# Patient Record
Sex: Female | Born: 1980 | Hispanic: No | Marital: Married | Smoking: Never smoker
Health system: Southern US, Community
[De-identification: ages and names within clinical notes are randomized; demographics above are authoritative.]

## PROBLEM LIST (undated history)

## (undated) DIAGNOSIS — N979 Female infertility, unspecified: Secondary | ICD-10-CM

## (undated) DIAGNOSIS — O139 Gestational [pregnancy-induced] hypertension without significant proteinuria, unspecified trimester: Secondary | ICD-10-CM

## (undated) DIAGNOSIS — O1404 Mild to moderate pre-eclampsia, complicating childbirth: Secondary | ICD-10-CM

## (undated) DIAGNOSIS — O09819 Supervision of pregnancy resulting from assisted reproductive technology, unspecified trimester: Secondary | ICD-10-CM

---

## 2011-01-14 LAB — ANTIBODY SCREEN: Antibody Screen: NEGATIVE

## 2011-01-14 LAB — ABO/RH

## 2011-04-16 ENCOUNTER — Other Ambulatory Visit (HOSPITAL_COMMUNITY): Payer: Self-pay | Admitting: Obstetrics and Gynecology

## 2011-04-16 DIAGNOSIS — O30109 Triplet pregnancy, unspecified number of placenta and unspecified number of amniotic sacs, unspecified trimester: Secondary | ICD-10-CM

## 2011-04-27 ENCOUNTER — Inpatient Hospital Stay (HOSPITAL_COMMUNITY): Admission: RE | Admit: 2011-04-27 | Payer: Self-pay | Source: Ambulatory Visit

## 2011-05-09 ENCOUNTER — Ambulatory Visit (HOSPITAL_COMMUNITY)
Admission: RE | Admit: 2011-05-09 | Discharge: 2011-05-09 | Disposition: A | Discharge status: Home / Self Care | Payer: PRIVATE HEALTH INSURANCE | Source: Ambulatory Visit | Attending: Obstetrics and Gynecology | Admitting: Obstetrics and Gynecology

## 2011-05-09 ENCOUNTER — Encounter (HOSPITAL_COMMUNITY): Payer: Self-pay

## 2011-05-09 DIAGNOSIS — O358XX0 Maternal care for other (suspected) fetal abnormality and damage, not applicable or unspecified: Secondary | ICD-10-CM | POA: Insufficient documentation

## 2011-05-09 DIAGNOSIS — Z1389 Encounter for screening for other disorder: Secondary | ICD-10-CM | POA: Insufficient documentation

## 2011-05-09 DIAGNOSIS — Z363 Encounter for antenatal screening for malformations: Secondary | ICD-10-CM | POA: Insufficient documentation

## 2011-05-09 DIAGNOSIS — O30109 Triplet pregnancy, unspecified number of placenta and unspecified number of amniotic sacs, unspecified trimester: Secondary | ICD-10-CM | POA: Insufficient documentation

## 2011-05-09 HISTORY — DX: Female infertility, unspecified: N97.9

## 2011-05-09 HISTORY — DX: Supervision of pregnancy resulting from assisted reproductive technology, unspecified trimester: O09.819

## 2011-06-08 ENCOUNTER — Ambulatory Visit (HOSPITAL_COMMUNITY)
Admission: RE | Admit: 2011-06-08 | Discharge: 2011-06-08 | Disposition: A | Discharge status: Home / Self Care | Payer: PRIVATE HEALTH INSURANCE | Source: Ambulatory Visit | Attending: Obstetrics and Gynecology | Admitting: Obstetrics and Gynecology

## 2011-06-08 DIAGNOSIS — Z3689 Encounter for other specified antenatal screening: Secondary | ICD-10-CM | POA: Insufficient documentation

## 2011-06-08 DIAGNOSIS — O30109 Triplet pregnancy, unspecified number of placenta and unspecified number of amniotic sacs, unspecified trimester: Secondary | ICD-10-CM

## 2011-07-04 ENCOUNTER — Ambulatory Visit (HOSPITAL_COMMUNITY)
Admission: RE | Admit: 2011-07-04 | Discharge: 2011-07-04 | Disposition: A | Discharge status: Home / Self Care | Payer: PRIVATE HEALTH INSURANCE | Source: Ambulatory Visit | Attending: Obstetrics and Gynecology | Admitting: Obstetrics and Gynecology

## 2011-07-04 DIAGNOSIS — Z3689 Encounter for other specified antenatal screening: Secondary | ICD-10-CM | POA: Insufficient documentation

## 2011-07-04 DIAGNOSIS — O30109 Triplet pregnancy, unspecified number of placenta and unspecified number of amniotic sacs, unspecified trimester: Secondary | ICD-10-CM | POA: Insufficient documentation

## 2011-07-06 ENCOUNTER — Ambulatory Visit (HOSPITAL_COMMUNITY): Payer: PRIVATE HEALTH INSURANCE

## 2011-07-23 ENCOUNTER — Ambulatory Visit (HOSPITAL_COMMUNITY)
Admission: RE | Admit: 2011-07-23 | Discharge: 2011-07-23 | Disposition: A | Discharge status: Home / Self Care | Payer: PRIVATE HEALTH INSURANCE | Source: Ambulatory Visit | Attending: Obstetrics and Gynecology | Admitting: Obstetrics and Gynecology

## 2011-07-23 VITALS — BP 105/58 | HR 100 | Wt 160.0 lb

## 2011-07-23 DIAGNOSIS — Z3689 Encounter for other specified antenatal screening: Secondary | ICD-10-CM | POA: Insufficient documentation

## 2011-07-23 DIAGNOSIS — O30109 Triplet pregnancy, unspecified number of placenta and unspecified number of amniotic sacs, unspecified trimester: Secondary | ICD-10-CM | POA: Insufficient documentation

## 2011-07-25 ENCOUNTER — Inpatient Hospital Stay (HOSPITAL_COMMUNITY): Admission: RE | Admit: 2011-07-25 | Payer: PRIVATE HEALTH INSURANCE | Source: Ambulatory Visit

## 2011-08-15 ENCOUNTER — Other Ambulatory Visit (HOSPITAL_COMMUNITY): Payer: Self-pay | Admitting: Maternal and Fetal Medicine

## 2011-08-15 ENCOUNTER — Ambulatory Visit (HOSPITAL_COMMUNITY)
Admission: RE | Admit: 2011-08-15 | Discharge: 2011-08-15 | Disposition: A | Discharge status: Home / Self Care | Payer: PRIVATE HEALTH INSURANCE | Source: Ambulatory Visit | Attending: Obstetrics and Gynecology | Admitting: Obstetrics and Gynecology

## 2011-08-15 DIAGNOSIS — Z3689 Encounter for other specified antenatal screening: Secondary | ICD-10-CM | POA: Insufficient documentation

## 2011-08-15 DIAGNOSIS — O30109 Triplet pregnancy, unspecified number of placenta and unspecified number of amniotic sacs, unspecified trimester: Secondary | ICD-10-CM

## 2011-08-18 ENCOUNTER — Observation Stay (HOSPITAL_COMMUNITY)
Admission: AD | Admit: 2011-08-18 | Discharge: 2011-08-18 | Disposition: A | Discharge status: Home / Self Care | Payer: PRIVATE HEALTH INSURANCE | Source: Ambulatory Visit | Attending: Obstetrics and Gynecology | Admitting: Obstetrics and Gynecology

## 2011-08-18 DIAGNOSIS — IMO0002 Reserved for concepts with insufficient information to code with codable children: Secondary | ICD-10-CM | POA: Insufficient documentation

## 2011-08-18 DIAGNOSIS — R030 Elevated blood-pressure reading, without diagnosis of hypertension: Secondary | ICD-10-CM | POA: Insufficient documentation

## 2011-08-18 DIAGNOSIS — O30109 Triplet pregnancy, unspecified number of placenta and unspecified number of amniotic sacs, unspecified trimester: Secondary | ICD-10-CM | POA: Insufficient documentation

## 2011-08-18 DIAGNOSIS — O99891 Other specified diseases and conditions complicating pregnancy: Principal | ICD-10-CM | POA: Insufficient documentation

## 2011-08-18 LAB — COMPREHENSIVE METABOLIC PANEL
ALT: 10 U/L (ref 0–35)
AST: 21 U/L (ref 0–37)
Albumin: 2.2 g/dL — ABNORMAL LOW (ref 3.5–5.2)
CO2: 20 mEq/L (ref 19–32)
Calcium: 8.2 mg/dL — ABNORMAL LOW (ref 8.4–10.5)
Sodium: 133 mEq/L — ABNORMAL LOW (ref 135–145)
Total Protein: 5.8 g/dL — ABNORMAL LOW (ref 6.0–8.3)

## 2011-08-18 LAB — URINALYSIS, ROUTINE W REFLEX MICROSCOPIC
Bilirubin Urine: NEGATIVE
Nitrite: NEGATIVE
Specific Gravity, Urine: >1.03 — ABNORMAL HIGH (ref 1.005–1.030)
Urobilinogen, UA: 0.2 mg/dL (ref 0.0–1.0)

## 2011-08-18 LAB — CBC
MCH: 29.9 pg (ref 26.0–34.0)
MCHC: 34.3 g/dL (ref 30.0–36.0)
Platelets: 155 10*3/uL (ref 150–400)
RBC: 3.24 MIL/uL — ABNORMAL LOW (ref 3.87–5.11)
RDW: 13.3 % (ref 11.5–15.5)

## 2011-08-18 LAB — URINE MICROSCOPIC-ADD ON

## 2011-08-18 NOTE — ED Provider Notes (Signed)
History    Cc: elev BP @ pharmacy( BP 146/85  31 yo G1P0 MF w/ IVF triplets @ 32 6/[redacted] weeks gestation presents for evaluation of BP 146/85 > pt has recently started having increased leg swelling. (+) nausea yesterday denies heartburn or epigastric pain. No h/a. Nl PIH labs yesterday No chief complaint on file.    OB History    Grav Para Term Preterm Abortions TAB SAB Ect Mult Living   1 0 0 0 0 0 0 0 0 0       Past Medical History  Diagnosis Date  . Infertility, female   . Pregnancy resulting from in vitro fertilization     No past surgical history on file.  No family history on file.  History  Substance Use Topics  . Smoking status: Not on file  . Smokeless tobacco: Not on file  . Alcohol Use: Not on file    Allergies: No Known Allergies  Prescriptions prior to admission  Medication Sig Dispense Refill  . IRON PO Take by mouth.        Marland Kitchen PRENATAL VITAMINS PO Take by mouth.           Physical Exam   Last menstrual period 12/26/2010. BP 123/73, BP 130/80 General appearance: alert, cooperative and no distress Lungs: clear to auscultation bilaterally Heart: regular rate and rhythm, S1, S2 normal, no murmur, click, rub or gallop Abdomen: gravid soft nontender Extremities: edema 2+ (-)clonus DTR 2+  AST 21 ALT 10. hgb 9.7 plt 155k, uric acid 4.3 ED Course  Triplets @ 32 6/7 weeks Peripheral edema P) check u/a. Do 24 hr UTP/CrCl NST x 3. Labs nl d/c home MDM   Madailein Londo A, MD 1:27 PM 08/18/2011

## 2011-08-18 NOTE — Discharge Instructions (Signed)
Preterm Labor Preterm labor is when labor starts at less than 37 weeks of pregnancy. The normal length of a pregnancy is 39 to 41 weeks. CAUSES Often, there is no identifiable underlying cause as to why a woman goes into preterm labor. However, one of the most common known causes of preterm labor is infection. Infections of the uterus, cervix, vagina, amniotic sac, bladder, kidney, or even the lungs (pneumonia) can cause labor to start. Other causes of preterm labor include:  Urogenital infections, such as yeast infections and bacterial vaginosis.   Uterine abnormalities (uterine shape, uterine septum, fibroids, bleeding from the placenta).   A cervix that has been operated on and opens prematurely.   Malformations in the baby.   Multiple gestations (twins, triplets, and so on).   Breakage of the amniotic sac.  Additional risk factors for preterm labor include:  Previous history of preterm labor.   Premature rupture of membranes (PROM).   A placenta that covers the opening of the cervix (placenta previa).   A placenta that separates from the uterus (placenta abruption).   A cervix that is too weak to hold the baby in the uterus (incompetence cervix).   Having too much fluid in the amniotic sac (polyhydramnios).   Taking illegal drugs or smoking while pregnant.   Not gaining enough weight while pregnant.   Women younger than 60 and older than 30 years old.   Low socioeconomic status.   African-American ethnicity.  SYMPTOMS Signs and symptoms of preterm labor include:  Menstrual-like cramps.   Contractions that are 30 to 70 seconds apart, become very regular, closer together, and are more intense and painful.   Contractions that start on the top of the uterus and spread down to the lower abdomen and back.   A sense of increased pelvic pressure or back pain.   A watery or bloody discharge that comes from the vagina.  DIAGNOSIS  A diagnosis can be confirmed by:  A  vaginal exam.   An ultrasound of the cervix.   Sampling (swabbing) cervico-vaginal secretions. These samples can be tested for the presence of fetal fibronectin. This is a protein found in cervical discharge which is associated with preterm labor.   Fetal monitoring.  TREATMENT  Depending on the length of the pregnancy and other circumstances, a caregiver may suggest bed rest. If necessary, there are medicines that can be given to stop contractions and to quicken fetal lung maturity. If labor happens before 34 weeks of pregnancy, a prolonged hospital stay may be recommended. Treatment depends on the condition of both the mother and baby. PREVENTION There are some things a mother can do to lower the risk of preterm labor in future pregnancies. A woman can:   Stop smoking.   Maintain healthy weight gain and avoid chemicals and drugs that are not necessary.   Be watchful for any type of infection.   Inform her caregiver if she has a known history of preterm labor.  Document Released: 08/18/2003 Document Revised: 05/17/2011 Document Reviewed: 09/22/2010 [employer] Patient Information 2012 [employer].24-Hour Urine Collection HOME CARE  When you get up in the morning on the day you do this test, pee (urinate) in the toilet and flush. Make a note of the time. This will be your start time on the day of collection and the end time on the next morning.   From then on, save all your pee (urine) in the plastic jug that was given to you.   You should stop  collecting your pee 24 hours after you started.   If the plastic jug that is given to you already has liquid in it, that is okay. Do not throw out the liquid or rinse out the jug. Some tests need the liquid to be added to your pee.   Keep your plastic jug cool (in an ice chest or the refrigerator) during the test.   When the 24 hours is over, bring your plastic jug to the clinic lab. Keep the jug cool (in an ice chest) while you are bringing  it to the lab.  Document Released: 08/24/2008 Document Revised: 05/17/2011 Document Reviewed: 08/24/2008 [employer] Patient Information 2012 [employer]CHypertension During Pregnancy Hypertension is also called high blood pressure. Blood pressure moves blood in your body. Sometimes, the force that moves the blood becomes too strong. When you are pregnant, this condition should be watched carefully. It can cause problems for you and your baby. HOME CARE   Make and keep all of your doctor visits.   Take medicine as told by your doctor. Tell your doctor about all medicines you take.   Eat very little salt.   Exercise regularly.   Do not drink alcohol.   Do not smoke.   Do not have drinks with caffeine.   Lie on your left side when resting.  GET HELP RIGHT AWAY IF:  You have bad belly (abdominal) pain.   You have sudden puffiness (swelling) in the hands, ankles, or face.   You gain 4 pounds (1.8 kilograms) or more in 1 week.   You throw up (vomit) repeatedly.   You have bleeding from the vagina.   You do not feel the baby moving as much.   You have a headache.   You have blurred or double vision.   You have muscle twitching or spasms.   You have shortness of breath.   You have blue fingernails and lips.   You have blood in your pee (urine).  MAKE SURE YOU:  Understand these instructions.   Will watch your condition.   Will get help right away if you are not doing well.  Document Released: 06/30/2010 Document Revised: 05/17/2011 Document Reviewed: 01/12/2011 [facility] Patient Information 2012 [place], [place].Marland Kitchen

## 2011-08-18 NOTE — Progress Notes (Signed)
Patient taken off external monitoring. Discharge instructions given and patient verbalized understanding.

## 2011-08-18 NOTE — Progress Notes (Signed)
Monitors taken off for D/C

## 2011-08-18 NOTE — Discharge Summary (Signed)
Patient transferred home into family care per MD.

## 2011-08-18 NOTE — Progress Notes (Signed)
Dr. Cherly Hensen updated that patient has been received in L&D. New orders received.

## 2011-08-18 NOTE — Progress Notes (Signed)
Refer to paper strip. Fetal monitor error. Monitor error. RN and MD aware.

## 2011-08-18 NOTE — Progress Notes (Signed)
Dr. Cherly Hensen updated on patient status and lab results. New orders received to D/C and send patient home with 24 hour urine to start in AM.

## 2011-08-22 ENCOUNTER — Encounter (HOSPITAL_COMMUNITY): Payer: Self-pay

## 2011-08-22 ENCOUNTER — Other Ambulatory Visit (HOSPITAL_COMMUNITY): Payer: Self-pay | Admitting: Obstetrics and Gynecology

## 2011-08-22 ENCOUNTER — Ambulatory Visit (HOSPITAL_COMMUNITY): Admission: RE | Admit: 2011-08-22 | Payer: PRIVATE HEALTH INSURANCE | Source: Ambulatory Visit

## 2011-08-22 ENCOUNTER — Inpatient Hospital Stay (HOSPITAL_COMMUNITY)
Admission: AD | Admit: 2011-08-22 | Discharge: 2011-08-27 | DRG: 765 | Disposition: A | Discharge status: Home / Self Care | Payer: PRIVATE HEALTH INSURANCE | Source: Ambulatory Visit | Attending: Obstetrics and Gynecology | Admitting: Obstetrics and Gynecology

## 2011-08-22 ENCOUNTER — Other Ambulatory Visit (HOSPITAL_COMMUNITY): Payer: Self-pay | Admitting: Maternal and Fetal Medicine

## 2011-08-22 ENCOUNTER — Ambulatory Visit (HOSPITAL_COMMUNITY)
Admission: RE | Admit: 2011-08-22 | Discharge: 2011-08-22 | Disposition: A | Discharge status: Home / Self Care | Payer: PRIVATE HEALTH INSURANCE | Source: Ambulatory Visit | Attending: Obstetrics and Gynecology | Admitting: Obstetrics and Gynecology

## 2011-08-22 DIAGNOSIS — Z3689 Encounter for other specified antenatal screening: Secondary | ICD-10-CM | POA: Insufficient documentation

## 2011-08-22 DIAGNOSIS — O30109 Triplet pregnancy, unspecified number of placenta and unspecified number of amniotic sacs, unspecified trimester: Secondary | ICD-10-CM

## 2011-08-22 DIAGNOSIS — O9902 Anemia complicating childbirth: Secondary | ICD-10-CM | POA: Diagnosis present

## 2011-08-22 DIAGNOSIS — IMO0002 Reserved for concepts with insufficient information to code with codable children: Principal | ICD-10-CM | POA: Diagnosis present

## 2011-08-22 DIAGNOSIS — O1404 Mild to moderate pre-eclampsia, complicating childbirth: Secondary | ICD-10-CM | POA: Diagnosis present

## 2011-08-22 DIAGNOSIS — D649 Anemia, unspecified: Secondary | ICD-10-CM | POA: Diagnosis present

## 2011-08-22 HISTORY — DX: Gestational (pregnancy-induced) hypertension without significant proteinuria, unspecified trimester: O13.9

## 2011-08-22 HISTORY — DX: Mild to moderate pre-eclampsia, complicating childbirth: O14.04

## 2011-08-22 LAB — COMPREHENSIVE METABOLIC PANEL
Albumin: 2.4 g/dL — ABNORMAL LOW (ref 3.5–5.2)
BUN: 9 mg/dL (ref 6–23)
Chloride: 101 mEq/L (ref 96–112)
Creatinine, Ser: 0.65 mg/dL (ref 0.50–1.10)
Total Bilirubin: 0.3 mg/dL (ref 0.3–1.2)

## 2011-08-22 LAB — URIC ACID: Uric Acid, Serum: 4.8 mg/dL (ref 2.4–7.0)

## 2011-08-22 MED ORDER — DEXTROSE 5 % IV SOLN
500.0000 mg | INTRAVENOUS | Status: DC
  Filled 2011-08-22: qty 500

## 2011-08-22 MED ORDER — ENOXAPARIN SODIUM 30 MG/0.3ML ~~LOC~~ SOLN: 30.0000 mg | SUBCUTANEOUS | Status: DC

## 2011-08-22 MED ORDER — ACETAMINOPHEN 325 MG PO TABS: 650.0000 mg | ORAL_TABLET | ORAL | Status: DC | PRN

## 2011-08-22 MED ORDER — ZOLPIDEM TARTRATE 10 MG PO TABS: 10.0000 mg | ORAL_TABLET | Freq: Every evening | ORAL | Status: DC | PRN

## 2011-08-22 MED ORDER — AZITHROMYCIN 500 MG PO TABS: 500.0000 mg | ORAL_TABLET | Freq: Every day | ORAL | Status: DC

## 2011-08-22 MED ORDER — NIFEDIPINE 10 MG PO CAPS: 20.0000 mg | ORAL_CAPSULE | Freq: Once | ORAL | Status: DC

## 2011-08-22 MED ORDER — NIFEDIPINE 10 MG PO CAPS: 10.0000 mg | ORAL_CAPSULE | Freq: Four times a day (QID) | ORAL | Status: DC

## 2011-08-22 MED ORDER — DEXTROSE IN LACTATED RINGERS 5 % IV SOLN
INTRAVENOUS | Status: DC
  Administered 2011-08-22: 20:00:00 via INTRAVENOUS

## 2011-08-22 NOTE — H&P (Signed)
Allison Santos is a 31 y.o. female presenting@ 34 1/[redacted] wk gestation w/ triplets and newly diagnosed preeclampsia. Pt c/o increased leg swelling. (+) heartburn starting since yesterday. (+) mild h/a denies visual changes. Pt was seen in MFM today and was found to have elev BP and sent to office for eval. She had been seen over w/e for eval of elev BP. PIH labs were normal and pt was sent home to do 24 hr UTP. The 24 hr UTP was over 700mg . sono 3/6 @ MFM had shown triplet B's growth less than third percentile for parameters. Given new findings of preeclampsia in the setting of growth concern, delivery indicated. Pt last ate @ 1: 40 pm Case reviewed with MFM History OB History    Grav Para Term Preterm Abortions TAB SAB Ect Mult Living   1 0 0 0 0 0 0 0 0 0      Past Medical History  Diagnosis Date  . Infertility, female   . Pregnancy resulting from in vitro fertilization    History reviewed. No pertinent past surgical history. Family History: family history is not on file. Social History:  reports that she has never smoked. She has never used smokeless tobacco. She reports that she does not drink alcohol or use illicit drugs.  ROSsee HPI  Dilation: 1 Exam by:: Dr. Cherly Hensen 1/50/oop Blood pressure 136/83, pulse 85, last menstrual period 12/26/2010. Maternal Exam:  Uterine Assessment: Contraction strength is mild.  Contraction frequency is irregular.   Abdomen: Patient reports no abdominal tenderness. Fetal presentation: breech  Introitus: Amniotic fluid character: not assessed.  Cervix: Cervix evaluated by digital exam.     Physical Exam  Constitutional: She is oriented to person, place, and time. She appears well-developed and well-nourished.  HENT:  Head: Normocephalic.  Eyes: EOM are normal. No scleral icterus.  Neck: Neck supple.  Cardiovascular: Regular rhythm.   Respiratory: Breath sounds normal.  GI: Soft.       Gravid nontender mild palp ctx  Musculoskeletal: She exhibits  edema.       3 (+)  Neurological: She is alert and oriented to person, place, and time.  Skin: Skin is warm and dry.  Psychiatric: She has a normal mood and affect.    Prenatal labs: ABO, Rh:  B positive Antibody:  neg Rubella:  Immune RPR:   NR HBsAg:   neg HIV:   neg GBS:   unknown  Assessment/Plan: Preeclampsia Triplets @ 34 1/7 wk P) admit. Repeat PIH labs. Primary C/S. NST x 3 q shift and continuous fetal monitoring. Given current BP will proceed with C/S in am with staffing. Pt and husband anxious but situation explained Synethia Endicott A 08/22/2011, 6:26 PM

## 2011-08-23 ENCOUNTER — Encounter (HOSPITAL_COMMUNITY): Payer: Self-pay | Admitting: Anesthesiology

## 2011-08-23 ENCOUNTER — Encounter (HOSPITAL_COMMUNITY): Payer: Self-pay | Admitting: *Deleted

## 2011-08-23 ENCOUNTER — Encounter (HOSPITAL_COMMUNITY)
Admission: AD | Disposition: A | Discharge status: Home / Self Care | Payer: Self-pay | Source: Ambulatory Visit | Attending: Obstetrics and Gynecology

## 2011-08-23 ENCOUNTER — Inpatient Hospital Stay (HOSPITAL_COMMUNITY): Payer: PRIVATE HEALTH INSURANCE | Admitting: Anesthesiology

## 2011-08-23 LAB — COMPREHENSIVE METABOLIC PANEL
Alkaline Phosphatase: 119 U/L — ABNORMAL HIGH (ref 39–117)
BUN: 5 mg/dL — ABNORMAL LOW (ref 6–23)
Chloride: 105 mEq/L (ref 96–112)
Creatinine, Ser: 0.52 mg/dL (ref 0.50–1.10)
GFR calc Af Amer: >90 mL/min (ref >90)
GFR calc non Af Amer: >90 mL/min (ref >90)
Glucose, Bld: 66 mg/dL — ABNORMAL LOW (ref 70–99)
Potassium: 3.5 mEq/L (ref 3.5–5.1)
Total Bilirubin: 0.2 mg/dL — ABNORMAL LOW (ref 0.3–1.2)

## 2011-08-23 LAB — ABO/RH: ABO/RH(D): B POS

## 2011-08-23 LAB — CBC
Hemoglobin: 9.6 g/dL — ABNORMAL LOW (ref 12.0–15.0)
MCH: 29.5 pg (ref 26.0–34.0)
MCH: 29.3 pg (ref 26.0–34.0)
MCHC: 33.6 g/dL (ref 30.0–36.0)
MCV: 86.8 fL (ref 78.0–100.0)
Platelets: 151 10*3/uL (ref 150–400)
Platelets: 145 10*3/uL — ABNORMAL LOW (ref 150–400)
RBC: 3.25 MIL/uL — ABNORMAL LOW (ref 3.87–5.11)
RBC: 2.97 MIL/uL — ABNORMAL LOW (ref 3.87–5.11)
RDW: 13 % (ref 11.5–15.5)

## 2011-08-23 LAB — MRSA PCR SCREENING: MRSA by PCR: NEGATIVE

## 2011-08-23 LAB — MAGNESIUM: Magnesium: 3.7 mg/dL — ABNORMAL HIGH (ref 1.5–2.5)

## 2011-08-23 LAB — URIC ACID: Uric Acid, Serum: 5.4 mg/dL (ref 2.4–7.0)

## 2011-08-23 LAB — RPR: RPR Ser Ql: NONREACTIVE

## 2011-08-23 SURGERY — Surgical Case
Anesthesia: Spinal | Wound class: Clean Contaminated

## 2011-08-23 MED ORDER — DIPHENHYDRAMINE HCL 50 MG/ML IJ SOLN
12.5000 mg | INTRAMUSCULAR | Status: DC | PRN
  Administered 2011-08-23 (×2): 12.5 mg via INTRAVENOUS
  Filled 2011-08-23: qty 1

## 2011-08-23 MED ORDER — ONDANSETRON HCL 4 MG PO TABS: 4.0000 mg | ORAL_TABLET | ORAL | Status: DC | PRN

## 2011-08-23 MED ORDER — MAGNESIUM SULFATE BOLUS VIA INFUSION
4.0000 g | Freq: Once | INTRAVENOUS | Status: AC
  Administered 2011-08-23: 4 g via INTRAVENOUS
  Filled 2011-08-23: qty 500

## 2011-08-23 MED ORDER — MORPHINE SULFATE (PF) 0.5 MG/ML IJ SOLN
INTRAMUSCULAR | Status: DC | PRN
  Administered 2011-08-23: .1 mg via EPIDURAL

## 2011-08-23 MED ORDER — LANOLIN HYDROUS EX OINT: TOPICAL_OINTMENT | CUTANEOUS | Status: DC | PRN

## 2011-08-23 MED ORDER — NALBUPHINE HCL 10 MG/ML IJ SOLN
5.0000 mg | INTRAMUSCULAR | Status: DC | PRN
  Filled 2011-08-23: qty 1

## 2011-08-23 MED ORDER — CEFAZOLIN SODIUM 1-5 GM-% IV SOLN
1.0000 g | Freq: Three times a day (TID) | INTRAVENOUS | Status: AC
  Administered 2011-08-23 (×2): 1 g via INTRAVENOUS
  Filled 2011-08-23 (×2): qty 50

## 2011-08-23 MED ORDER — LACTATED RINGERS IV SOLN
INTRAVENOUS | Status: DC | PRN
  Administered 2011-08-23 (×3): via INTRAVENOUS

## 2011-08-23 MED ORDER — CEFAZOLIN SODIUM-DEXTROSE 2-3 GM-% IV SOLR
2.0000 g | INTRAVENOUS | Status: AC
  Administered 2011-08-23: 2 g via INTRAVENOUS
  Filled 2011-08-23: qty 50

## 2011-08-23 MED ORDER — MORPHINE SULFATE 0.5 MG/ML IJ SOLN
INTRAMUSCULAR | Status: AC
  Filled 2011-08-23: qty 10

## 2011-08-23 MED ORDER — DIPHENHYDRAMINE HCL 50 MG/ML IJ SOLN: 25.0000 mg | INTRAMUSCULAR | Status: DC | PRN

## 2011-08-23 MED ORDER — KETOROLAC TROMETHAMINE 30 MG/ML IJ SOLN: 30.0000 mg | Freq: Four times a day (QID) | INTRAMUSCULAR | Status: AC | PRN

## 2011-08-23 MED ORDER — PHENYLEPHRINE HCL 10 MG/ML IJ SOLN
INTRAMUSCULAR | Status: DC | PRN
  Administered 2011-08-23: 80 ug via INTRAVENOUS
  Administered 2011-08-23: 40 ug via INTRAVENOUS
  Administered 2011-08-23: 80 ug via INTRAVENOUS

## 2011-08-23 MED ORDER — PHENYLEPHRINE 40 MCG/ML (10ML) SYRINGE FOR IV PUSH (FOR BLOOD PRESSURE SUPPORT)
PREFILLED_SYRINGE | INTRAVENOUS | Status: AC
  Filled 2011-08-23: qty 5

## 2011-08-23 MED ORDER — BUPIVACAINE HCL (PF) 0.25 % IJ SOLN
INTRAMUSCULAR | Status: DC | PRN
  Administered 2011-08-23: 5 mL

## 2011-08-23 MED ORDER — KETOROLAC TROMETHAMINE 30 MG/ML IJ SOLN: 15.0000 mg | Freq: Once | INTRAMUSCULAR | Status: DC | PRN

## 2011-08-23 MED ORDER — ONDANSETRON HCL 4 MG/2ML IJ SOLN
INTRAMUSCULAR | Status: DC | PRN
  Administered 2011-08-23: 4 mg via INTRAVENOUS

## 2011-08-23 MED ORDER — TETANUS-DIPHTH-ACELL PERTUSSIS 5-2.5-18.5 LF-MCG/0.5 IM SUSP
0.5000 mL | Freq: Once | INTRAMUSCULAR | Status: DC
  Filled 2011-08-23: qty 0.5

## 2011-08-23 MED ORDER — SODIUM CHLORIDE 0.9 % IV SOLN
1.0000 ug/kg/h | INTRAVENOUS | Status: DC | PRN
  Filled 2011-08-23: qty 2.5

## 2011-08-23 MED ORDER — NALOXONE HCL 0.4 MG/ML IJ SOLN: 0.4000 mg | INTRAMUSCULAR | Status: DC | PRN

## 2011-08-23 MED ORDER — FENTANYL CITRATE 0.05 MG/ML IJ SOLN
INTRAMUSCULAR | Status: AC
  Filled 2011-08-23: qty 2

## 2011-08-23 MED ORDER — ONDANSETRON HCL 4 MG/2ML IJ SOLN
INTRAMUSCULAR | Status: AC
  Filled 2011-08-23: qty 2

## 2011-08-23 MED ORDER — KETOROLAC TROMETHAMINE 60 MG/2ML IM SOLN
INTRAMUSCULAR | Status: AC
  Filled 2011-08-23: qty 2

## 2011-08-23 MED ORDER — ZOLPIDEM TARTRATE 5 MG PO TABS: 5.0000 mg | ORAL_TABLET | Freq: Every evening | ORAL | Status: DC | PRN

## 2011-08-23 MED ORDER — OXYTOCIN 10 UNIT/ML IJ SOLN
INTRAMUSCULAR | Status: AC
  Filled 2011-08-23: qty 4

## 2011-08-23 MED ORDER — BUPIVACAINE HCL (PF) 0.25 % IJ SOLN
INTRAMUSCULAR | Status: AC
  Filled 2011-08-23: qty 30

## 2011-08-23 MED ORDER — SENNOSIDES-DOCUSATE SODIUM 8.6-50 MG PO TABS
2.0000 | ORAL_TABLET | Freq: Every day | ORAL | Status: DC
  Administered 2011-08-23 – 2011-08-24 (×2): 2 via ORAL

## 2011-08-23 MED ORDER — MEPERIDINE HCL 25 MG/ML IJ SOLN: 6.2500 mg | INTRAMUSCULAR | Status: DC | PRN

## 2011-08-23 MED ORDER — DIPHENHYDRAMINE HCL 25 MG PO CAPS
25.0000 mg | ORAL_CAPSULE | ORAL | Status: DC | PRN
  Administered 2011-08-23: 25 mg via ORAL
  Filled 2011-08-23 (×2): qty 1

## 2011-08-23 MED ORDER — MAGNESIUM SULFATE 40 MG/ML IJ SOLN: 2.0000 g | Freq: Once | INTRAMUSCULAR | Status: DC

## 2011-08-23 MED ORDER — ONDANSETRON HCL 4 MG/2ML IJ SOLN: 4.0000 mg | Freq: Three times a day (TID) | INTRAMUSCULAR | Status: DC | PRN

## 2011-08-23 MED ORDER — PRENATAL MULTIVITAMIN CH
1.0000 | ORAL_TABLET | Freq: Every day | ORAL | Status: DC
  Administered 2011-08-24 – 2011-08-26 (×3): 1 via ORAL
  Administered 2011-08-27: 2 via ORAL
  Filled 2011-08-23 (×4): qty 1

## 2011-08-23 MED ORDER — DIBUCAINE 1 % RE OINT: 1.0000 "application " | TOPICAL_OINTMENT | RECTAL | Status: DC | PRN

## 2011-08-23 MED ORDER — CITRIC ACID-SODIUM CITRATE 334-500 MG/5ML PO SOLN
ORAL | Status: AC
  Administered 2011-08-23: 30 mL
  Filled 2011-08-23: qty 15

## 2011-08-23 MED ORDER — EPHEDRINE 5 MG/ML INJ
INTRAVENOUS | Status: AC
  Filled 2011-08-23: qty 10

## 2011-08-23 MED ORDER — OXYTOCIN 10 UNIT/ML IJ SOLN
INTRAMUSCULAR | Status: AC
  Filled 2011-08-23: qty 2

## 2011-08-23 MED ORDER — MAGNESIUM SULFATE BOLUS VIA INFUSION
2.0000 g | Freq: Once | INTRAVENOUS | Status: AC
  Administered 2011-08-23: 2 g via INTRAVENOUS
  Filled 2011-08-23: qty 500

## 2011-08-23 MED ORDER — FERROUS SULFATE 325 (65 FE) MG PO TABS
325.0000 mg | ORAL_TABLET | Freq: Two times a day (BID) | ORAL | Status: DC
  Administered 2011-08-24 – 2011-08-27 (×6): 325 mg via ORAL
  Filled 2011-08-23 (×7): qty 1

## 2011-08-23 MED ORDER — SCOPOLAMINE 1 MG/3DAYS TD PT72
MEDICATED_PATCH | TRANSDERMAL | Status: AC
  Filled 2011-08-23: qty 1

## 2011-08-23 MED ORDER — DEXTROSE IN LACTATED RINGERS 5 % IV SOLN: INTRAVENOUS | Status: DC

## 2011-08-23 MED ORDER — FENTANYL CITRATE 0.05 MG/ML IJ SOLN
INTRAMUSCULAR | Status: DC | PRN
  Administered 2011-08-23: 12.5 ug via INTRATHECAL

## 2011-08-23 MED ORDER — SCOPOLAMINE 1 MG/3DAYS TD PT72
1.0000 | MEDICATED_PATCH | Freq: Once | TRANSDERMAL | Status: AC
  Administered 2011-08-23: 1.5 mg via TRANSDERMAL

## 2011-08-23 MED ORDER — WITCH HAZEL-GLYCERIN EX PADS: 1.0000 "application " | MEDICATED_PAD | CUTANEOUS | Status: DC | PRN

## 2011-08-23 MED ORDER — DEXTROSE IN LACTATED RINGERS 5 % IV SOLN
INTRAVENOUS | Status: DC
  Administered 2011-08-23 – 2011-08-24 (×2): via INTRAVENOUS

## 2011-08-23 MED ORDER — 0.9 % SODIUM CHLORIDE (POUR BTL) OPTIME
TOPICAL | Status: DC | PRN
  Administered 2011-08-23: 1000 mL

## 2011-08-23 MED ORDER — DIPHENHYDRAMINE HCL 25 MG PO CAPS: 25.0000 mg | ORAL_CAPSULE | Freq: Four times a day (QID) | ORAL | Status: DC | PRN

## 2011-08-23 MED ORDER — EPHEDRINE SULFATE 50 MG/ML IJ SOLN
INTRAMUSCULAR | Status: DC | PRN
  Administered 2011-08-23: 10 mg via INTRAVENOUS
  Administered 2011-08-23: 5 mg via INTRAVENOUS
  Administered 2011-08-23: 10 mg via INTRAVENOUS
  Administered 2011-08-23 (×2): 5 mg via INTRAVENOUS
  Administered 2011-08-23: 10 mg via INTRAVENOUS

## 2011-08-23 MED ORDER — BUPIVACAINE IN DEXTROSE 0.75-8.25 % IT SOLN
INTRATHECAL | Status: DC | PRN
  Administered 2011-08-23: 1.2 mL via INTRATHECAL

## 2011-08-23 MED ORDER — PHENYLEPHRINE 40 MCG/ML (10ML) SYRINGE FOR IV PUSH (FOR BLOOD PRESSURE SUPPORT)
PREFILLED_SYRINGE | INTRAVENOUS | Status: AC
  Filled 2011-08-23: qty 10

## 2011-08-23 MED ORDER — BENZOCAINE-MENTHOL 20-0.5 % EX AERO: 1.0000 "application " | INHALATION_SPRAY | CUTANEOUS | Status: DC | PRN

## 2011-08-23 MED ORDER — SODIUM CHLORIDE 0.9 % IJ SOLN: 3.0000 mL | INTRAMUSCULAR | Status: DC | PRN

## 2011-08-23 MED ORDER — PROMETHAZINE HCL 25 MG/ML IJ SOLN: 6.2500 mg | INTRAMUSCULAR | Status: DC | PRN

## 2011-08-23 MED ORDER — MAGNESIUM SULFATE 40 G IN LACTATED RINGERS - SIMPLE: 2.0000 g/h | INTRAVENOUS | Status: DC

## 2011-08-23 MED ORDER — HYDROMORPHONE HCL PF 1 MG/ML IJ SOLN: 0.2500 mg | INTRAMUSCULAR | Status: DC | PRN

## 2011-08-23 MED ORDER — OXYTOCIN 10 UNIT/ML IJ SOLN
INTRAMUSCULAR | Status: DC | PRN
  Administered 2011-08-23 (×2): 40 [IU] via INTRAMUSCULAR

## 2011-08-23 MED ORDER — DIPHENHYDRAMINE HCL 50 MG/ML IJ SOLN
INTRAMUSCULAR | Status: AC
  Filled 2011-08-23: qty 1

## 2011-08-23 MED ORDER — MAGNESIUM SULFATE 40 G IN LACTATED RINGERS - SIMPLE
2.0000 g/h | INTRAVENOUS | Status: DC
  Administered 2011-08-23 – 2011-08-24 (×2): 2 g/h via INTRAVENOUS
  Filled 2011-08-23 (×2): qty 500

## 2011-08-23 MED ORDER — KETOROLAC TROMETHAMINE 60 MG/2ML IM SOLN
60.0000 mg | Freq: Once | INTRAMUSCULAR | Status: AC | PRN
  Administered 2011-08-23: 60 mg via INTRAMUSCULAR

## 2011-08-23 MED ORDER — IBUPROFEN 600 MG PO TABS
600.0000 mg | ORAL_TABLET | Freq: Four times a day (QID) | ORAL | Status: DC | PRN
  Administered 2011-08-27: 600 mg via ORAL
  Filled 2011-08-23 (×11): qty 1

## 2011-08-23 MED ORDER — ONDANSETRON HCL 4 MG/2ML IJ SOLN: 4.0000 mg | INTRAMUSCULAR | Status: DC | PRN

## 2011-08-23 MED ORDER — IBUPROFEN 600 MG PO TABS
600.0000 mg | ORAL_TABLET | Freq: Four times a day (QID) | ORAL | Status: DC
  Administered 2011-08-23 – 2011-08-26 (×12): 600 mg via ORAL
  Filled 2011-08-23 (×4): qty 1

## 2011-08-23 MED ORDER — SIMETHICONE 80 MG PO CHEW
80.0000 mg | CHEWABLE_TABLET | ORAL | Status: DC | PRN
Start: 2011-08-23 — End: 2011-08-27
  Administered 2011-08-23 – 2011-08-26 (×4): 80 mg via ORAL

## 2011-08-23 MED ORDER — OXYCODONE-ACETAMINOPHEN 5-325 MG PO TABS
1.0000 | ORAL_TABLET | ORAL | Status: DC | PRN
  Administered 2011-08-24 – 2011-08-25 (×4): 2 via ORAL
  Administered 2011-08-25: 1 via ORAL
  Filled 2011-08-23 (×2): qty 2
  Filled 2011-08-23: qty 1
  Filled 2011-08-23 (×2): qty 2

## 2011-08-23 SURGICAL SUPPLY — 48 items
BENZOIN TINCTURE PRP APPL 2/3 (GAUZE/BANDAGES/DRESSINGS) IMPLANT
CHLORAPREP W/TINT 26ML (MISCELLANEOUS) IMPLANT
CLOTH BEACON ORANGE TIMEOUT ST (SAFETY) ×2 IMPLANT
CONTAINER PREFILL 10% NBF 15ML (MISCELLANEOUS) IMPLANT
DRESSING TELFA 8X3 (GAUZE/BANDAGES/DRESSINGS) ×2 IMPLANT
DRSG COVADERM 4X10 (GAUZE/BANDAGES/DRESSINGS) ×2 IMPLANT
ELECT REM PT RETURN 9FT ADLT (ELECTROSURGICAL) ×2
ELECTRODE REM PT RTRN 9FT ADLT (ELECTROSURGICAL) ×1 IMPLANT
EXTRACTOR VACUUM KIWI (MISCELLANEOUS) IMPLANT
EXTRACTOR VACUUM M CUP 4 TUBE (SUCTIONS) IMPLANT
GAUZE SPONGE 4X4 12PLY STRL LF (GAUZE/BANDAGES/DRESSINGS) ×4 IMPLANT
GLOVE BIO SURGEON STRL SZ 6.5 (GLOVE) ×2 IMPLANT
GLOVE BIOGEL PI IND STRL 7.0 (GLOVE) ×2 IMPLANT
GLOVE BIOGEL PI INDICATOR 7.0 (GLOVE) ×2
GLOVE INDICATOR 7.0 STRL GRN (GLOVE) ×6 IMPLANT
GLOVE SURG SS PI 6.5 STRL IVOR (GLOVE) ×6 IMPLANT
GLOVE SURG SS PI 7.0 STRL IVOR (GLOVE) ×4 IMPLANT
GLOVE SURG SS PI 7.5 STRL IVOR (GLOVE) ×2 IMPLANT
GLOVE SURG SS PI 8.0 STRL IVOR (GLOVE) ×2 IMPLANT
GOWN PREVENTION PLUS LG XLONG (DISPOSABLE) ×6 IMPLANT
GOWN SURGICAL XLG (GOWNS) ×4 IMPLANT
KIT ABG SYR 3ML LUER SLIP (SYRINGE) ×6 IMPLANT
NEEDLE HYPO 25X1 1.5 SAFETY (NEEDLE) ×4 IMPLANT
NEEDLE HYPO 25X5/8 SAFETYGLIDE (NEEDLE) ×6 IMPLANT
NS IRRIG 1000ML POUR BTL (IV SOLUTION) ×2 IMPLANT
PACK C SECTION WH (CUSTOM PROCEDURE TRAY) ×2 IMPLANT
PAD ABD 7.5X8 STRL (GAUZE/BANDAGES/DRESSINGS) ×2 IMPLANT
RTRCTR C-SECT PINK 25CM LRG (MISCELLANEOUS) ×2 IMPLANT
SLEEVE SCD COMPRESS KNEE MED (MISCELLANEOUS) ×2 IMPLANT
SPONGE LAP 18X18 X RAY DECT (DISPOSABLE) ×2 IMPLANT
STAPLER VISISTAT 35W (STAPLE) IMPLANT
STRIP CLOSURE SKIN 1/2X4 (GAUZE/BANDAGES/DRESSINGS) IMPLANT
SUT CHROMIC GUT AB #0 18 (SUTURE) IMPLANT
SUT MNCRL 0 VIOLET CTX 36 (SUTURE) ×4 IMPLANT
SUT MON AB 4-0 PS1 27 (SUTURE) IMPLANT
SUT MONOCRYL 0 CTX 36 (SUTURE) ×4
SUT PLAIN 2 0 (SUTURE)
SUT PLAIN 2 0 XLH (SUTURE) ×2 IMPLANT
SUT PLAIN ABS 2-0 CT1 27XMFL (SUTURE) IMPLANT
SUT VIC AB 0 CT1 27 (SUTURE) ×4
SUT VIC AB 0 CT1 27XBRD ANBCTR (SUTURE) ×4 IMPLANT
SUT VIC AB 2-0 CT1 27 (SUTURE) ×1
SUT VIC AB 2-0 CT1 TAPERPNT 27 (SUTURE) ×1 IMPLANT
SUT VICRYL 0 TIES 12 18 (SUTURE) IMPLANT
SYR CONTROL 10ML LL (SYRINGE) ×2 IMPLANT
TOWEL OR 17X24 6PK STRL BLUE (TOWEL DISPOSABLE) ×4 IMPLANT
TRAY FOLEY CATH 14FR (SET/KITS/TRAYS/PACK) IMPLANT
WATER STERILE IRR 1000ML POUR (IV SOLUTION) ×2 IMPLANT

## 2011-08-23 NOTE — Anesthesia Procedure Notes (Signed)
Spinal  Patient location during procedure: OR Start time: 08/23/2011 10:36 AM End time: 08/23/2011 10:38 AM Reason for block: procedure for pain Staffing Anesthesiologist: Sandrea Hughs Performed by: anesthesiologist  Preanesthetic Checklist Completed: patient identified, site marked, surgical consent, pre-op evaluation, timeout performed, IV checked, risks and benefits discussed and monitors and equipment checked Spinal Block Patient position: sitting Prep: DuraPrep Patient monitoring: heart rate, cardiac monitor, continuous pulse ox and blood pressure Approach: midline Location: L3-4 Injection technique: single-shot Needle Needle type: Sprotte  Needle gauge: 24 G Needle length: 9 cm Needle insertion depth: 5 cm Assessment Sensory level: T4

## 2011-08-23 NOTE — Anesthesia Postprocedure Evaluation (Signed)
Anesthesia Post Note  Patient: Allison Santos  Procedure(s) Performed: Procedure(s) (LRB): CESAREAN SECTION (N/A)  Anesthesia type: Spinal  Patient location: PACU  Post pain: Pain level controlled  Post assessment: Post-op Vital signs reviewed  Last Vitals:  Filed Vitals:   08/23/11 0925  BP: 123/79  Pulse: 99  Temp: 37 C  Resp: 18    Post vital signs: Reviewed  Level of consciousness: awake  Complications: No apparent anesthesia complications

## 2011-08-23 NOTE — Progress Notes (Signed)
S: h/a better. Heartburn resolved (+) FM  O:  VS: BPP 118-136/77-83  ungs clear to A Cor RRR Abd: gravid nontender Extr: 3(+) edema clonus(-) DTR 2 +  PIH labs unchanged  Tracing reactive x 3  occ ctx   IMP: preeclampsia Triplets @ 34 2/7 wk  P) Primary C/S. Risk of procedure reviewed including infection, bleeding, injury to bladder,bowel, ureter. Poss blood transfusion and its risk, internal scar tissue. All ? Answered. Will start mag sulfate IV postpartum

## 2011-08-23 NOTE — Anesthesia Preprocedure Evaluation (Signed)
Anesthesia Evaluation  Patient identified by MRN, date of birth, ID band Patient awake    Reviewed: Allergy & Precautions, H&P , NPO status , Patient's Chart, lab work & pertinent test results  Airway Mallampati: I TM Distance: >3 FB Neck ROM: full    Dental No notable dental hx.    Pulmonary neg pulmonary ROS,    Pulmonary exam normal       Cardiovascular hypertension,     Neuro/Psych negative neurological ROS  negative psych ROS   GI/Hepatic negative GI ROS, Neg liver ROS,   Endo/Other  negative endocrine ROS  Renal/GU negative Renal ROS  negative genitourinary   Musculoskeletal negative musculoskeletal ROS (+)   Abdominal Normal abdominal exam  (+)   Peds negative pediatric ROS (+)  Hematology negative hematology ROS (+)   Anesthesia Other Findings   Reproductive/Obstetrics (+) Pregnancy                           Anesthesia Physical Anesthesia Plan  ASA: II  Anesthesia Plan: Spinal   Post-op Pain Management:    Induction:   Airway Management Planned:   Additional Equipment:   Intra-op Plan:   Post-operative Plan:   Informed Consent: I have reviewed the patients History and Physical, chart, labs and discussed the procedure including the risks, benefits and alternatives for the proposed anesthesia with the patient or authorized representative who has indicated his/her understanding and acceptance.     Plan Discussed with: CRNA and Surgeon  Anesthesia Plan Comments:         Anesthesia Quick Evaluation

## 2011-08-23 NOTE — Transfer of Care (Signed)
Immediate Anesthesia Transfer of Care Note  Patient: Allison Santos  Procedure(s) Performed: Procedure(s) (LRB): CESAREAN SECTION (N/A)  Patient Location: PACU  Anesthesia Type: Spinal  Level of Consciousness: awake, alert  and oriented  Airway & Oxygen Therapy: Patient Spontanous Breathing  Post-op Assessment: Report given to PACU RN and Post -op Vital signs reviewed and stable  Post vital signs: Reviewed and stable  Complications: No apparent anesthesia complications

## 2011-08-24 ENCOUNTER — Encounter (HOSPITAL_COMMUNITY): Payer: Self-pay

## 2011-08-24 DIAGNOSIS — D649 Anemia, unspecified: Secondary | ICD-10-CM | POA: Diagnosis present

## 2011-08-24 DIAGNOSIS — O9902 Anemia complicating childbirth: Secondary | ICD-10-CM | POA: Diagnosis present

## 2011-08-24 DIAGNOSIS — O1404 Mild to moderate pre-eclampsia, complicating childbirth: Secondary | ICD-10-CM

## 2011-08-24 HISTORY — DX: Mild to moderate pre-eclampsia, complicating childbirth: O14.04

## 2011-08-24 LAB — CBC
Hemoglobin: 8.2 g/dL — ABNORMAL LOW (ref 12.0–15.0)
MCH: 29.4 pg (ref 26.0–34.0)
MCHC: 33.7 g/dL (ref 30.0–36.0)
Platelets: 138 10*3/uL — ABNORMAL LOW (ref 150–400)
RDW: 13.1 % (ref 11.5–15.5)

## 2011-08-24 NOTE — Progress Notes (Signed)
UR chart review completed.  

## 2011-08-24 NOTE — Op Note (Signed)
Allison Santos, Allison Santos            ACCOUNT NO.:  [id number]  MEDICAL RECORD NO.:  [id number]  LOCATION:                                 FACILITY:  PHYSICIAN:  Maxie Better, M.D.DATE OF BIRTH:  02-19-1981  DATE OF PROCEDURE:  08/23/2011 DATE OF DISCHARGE:                              OPERATIVE REPORT   PREOPERATIVE DIAGNOSES:  Preeclampsia, triplet gestation at 64 and 2/7 weeks.  POSTOPERATIVE DIAGNOSES:  Triplet gestation at 28 and 2/7 weeks, preeclampsia.  PROCEDURE:  A primary cesarean section, Kerr hysterotomy.  ANESTHESIA:  Spinal.  SURGEON:  Maxie Better, MD  ASSISTANT:  Arlan Organ, CNM  PROCEDURE:  Under adequate spinal anesthesia, the patient was placed in a supine position with a left lateral tilt.  She was sterilely prepped and draped in usual fashion.  Indwelling Foley catheter was sterilely placed.  Marcaine 0.25% was injected along the planned Pfannenstiel skin incision site.  Pfannenstiel skin incision was then made, carried down to the rectus fascia.  Rectus fascia was opened transversely.  The rectus fascia was then bluntly and sharply dissected off the rectus muscle in superior and inferior fashion.  The rectus muscles split in midline.  The parietal peritoneum was entered sharply and extended.  The vesicouterine peritoneum was then inspected, well-developed lower uterine segment was noted.  Alexis self-retaining retractor was then placed without incident.  The vesicouterine peritoneum was opened transversely.  The bladder was then bluntly dissected off the lower uterine segment.  A curvilinear low-transverse uterine incision was then made and extended with bandage scissors.  Through the intact amniotic sac, fetus #1 was identified in a frank breech position.  Once the position was stabilized to the intact membranes, artificial rupture of membranes was done, clear amniotic fluid is noted and subsequent delivery of a live female using a breech  maneuvers was accomplished.  Baby was bulb suctioned from the abdomen.  The cord was clamped, cut.  The baby was transferred to the awaiting pediatrician who assigned Apgars of 7 and 8 at 1 and 5 minutes.  The 2nd baby also feet was identified in the sac that was intact.  Once the feet was identified and stabilized, artificial rupture of membrane of twin of triplet B was accomplished. Baby was also delivered in breech maneuvers.  Cord was clamped, cut. The baby was transferred to the awaiting pediatrician who assigned Apgars of 6 and 9 at 1 and 5 minutes.  The 3rd fetus was in the transverse position.  The internal version was performed.  Artificial rupture of membranes was done, where they had been stabilized and subsequent delivery of a live female in the vertex fashion.  Cord was around the neck, which was reducible.  The cord was clamped, cut.  The baby was transferred to the awaiting pediatrician who assigned Apgars of 7 and 9 at 1 and 5 minutes.  The cord bloods were obtained.  The placenta x3 was manually removed, sent to pathology.  Uterine cavity was cleaned of debris.  Uterine incision had no extension, it was closed in 2 layers, the 1st with 0 Monocryl running locked stitch, 2nd layer was imbricating using 0 Monocryl sutures.  Good hemostasis was noted. Normal tubes  and ovaries were noted bilaterally.  The paracolic gutters were cleaned of debris.  The peritoneum was closed with 2-0 Vicryl.  The rectus fascia closed with 0 Vicryl x2.  The subcutaneous area was irrigated with small bleeders, cauterized.  Interrupted 2-0 plain sutures placed and the skin approximated using Ethicon staples.  SPECIMEN:  Placenta x3, sent to pathology.  ESTIMATED BLOOD LOSS:  800 mL.  URINE OUTPUT:  75 mL.  INTRAOPERATIVE FLUIDS:  1500 mL.  Sponge and instrument counts x2 was correct.  Weights of the baby was 4 pounds 15 ounces, the female; 2nd baby was 3 pounds 8 ounces, third baby was 3  pounds 13 ounces.  All babies were transferred to the Neonatal Intensive Care Unit due to prematurity.  COMPLICATION:  None.  The patient tolerated the procedure well, and was transferred to recovery room in stable condition.     Maxie Better, M.D.     [place]/MEDQ  D:  08/23/2011  T:  08/24/2011  Job:  [id number]

## 2011-08-24 NOTE — Progress Notes (Signed)
Subjective: POD# 1 AICU Information for the patient's newborn:  Allison Santos, Allison Santos [[id number]]  female Information for the patient's newborn:  Allison Santos, Allison Santos [[id number]]  female Information for the patient's newborn:  Allison Santos, Allison Santos [[id number]]  female  Reports feeling well, good sleep last night, slight dizziness w/ initial ambulation Feeding: breast; expressing breast milk with lactation assistance Patient reports tolerating PO.  Breast symptoms:none Pain controlled with Motrin and Percocet Denies HA/SOB/C/P/N/V/dizziness. Flatus none. She reports vaginal bleeding as normal, without clots.  She is ambulating, urinating without difficult.     Objective:   VS:  Filed Vitals:   08/24/11 0800 08/24/11 0900 08/24/11 1000 08/24/11 1100  BP: 118/71 124/83 120/86 111/67  Pulse:  79  91  Temp:  98.7 F (37.1 C)    TempSrc:  Oral    Resp:  18 18   Height:      Weight:      SpO2:  100%  99%     Intake/Output Summary (Last 24 hours) at 08/24/11 1145 Last data filed at 08/24/11 1000  Gross per 24 hour  Intake 3111.91 ml  Output   3575 ml  Net -463.09 ml        Basename 08/24/11 0515 08/23/11 1445  WBC 8.0 8.8  HGB 8.2* 8.7*  HCT 24.3* 25.9*  PLT 138* 145*     Blood type: --/--/B POS, B POS (03/14 0528)  Rubella:       Physical Exam:  General: alert and cooperative CV: Regular rate and rhythm Resp: clear Abdomen: soft, nontender, hypoactie bowel sounds; slight distension Incision: clean, dry and intact Uterine Fundus: firm, below umbilicus -1; nontender Lochia: minimal Ext: edema +1 pedal; SCD's on bilaterally and Homans sign is negative, no sign of DVT      Assessment/Plan: 31 y.o.   POD# 1.  s/p Cesarean Delivery.  Indications: preeclampsia and triplet gestation              Principal Problem:  *PP care - s/p 1C/S 3/14 (triplet gest) Preeclampsia-mild; no signs of exacerbation; good diuresis in last 24 hours  Completed 24 hours of mag  sulfate; DC now Chronic anemia with superimposed blood loss anemia  Continue oral iron supplementation Doing well, stable post-op  Tx to regular postpartum floor.     Advance diet as tolerated   D/C foley   Ambulate   Lactation assistance PRN  Routine post-op care Dr. Cherly Hensen updated with plan; concurs  Juanetta Beets, SNM [facility] 08/24/2011, 11:45 AM

## 2011-08-25 LAB — CULTURE, BETA STREP (GROUP B ONLY)

## 2011-08-25 MED ORDER — HYDROCHLOROTHIAZIDE 25 MG PO TABS
25.0000 mg | ORAL_TABLET | Freq: Every day | ORAL | Status: DC
  Administered 2011-08-25 – 2011-08-27 (×3): 25 mg via ORAL
  Filled 2011-08-25 (×4): qty 1

## 2011-08-25 MED ORDER — FUROSEMIDE 20 MG PO TABS
20.0000 mg | ORAL_TABLET | Freq: Every day | ORAL | Status: DC
  Filled 2011-08-25: qty 1

## 2011-08-25 NOTE — Progress Notes (Signed)
SVD: Primary c/s (triplets)  S:  Pt reports feeling well/ Tolerating po/ Voiding without problems/ No n/v/ Bleeding is light/ Pain controlled withprescription NSAID's including ibuprofen (Motrin)    O:  A & O x 3 stable/ VS: Blood pressure 131/83, pulse 88, temperature 97.7 F (36.5 C), temperature source Oral, resp. rate 18, height 5' 4.96" (1.65 m), weight 154 lb 11.2 oz (70.171 kg), last menstrual period 12/26/2010, SpO2 100.00%, unknown if currently breastfeeding.  LABS: No results found for this or any previous visit (from the past 24 hour(s)).  I&O: I/O last 3 completed shifts: In: 2320.7 [P.O.:480; I.V.:1740.7; IV Piggyback:100] Out: 3020 [Urine:3020]      Lungs: chest clear, no wheezing, rales, normal symmetric air entry, Heart exam - S1, S2 normal, no murmur, no gallop, rate regular  Heart: regular rate and rhythm, S1, S2 normal, no murmur, click, rub or gallop  Abdomen: incision without erythema, without hematoma, without evidence of infection and  Uterus firm @ 1 FB above umb. Surgical tender, nondistended  Perineum: is normal  Lochia: min  Extremities:no redness or tenderness in the calves or thighs, no edema, edema 3+    A/P: POD # 2/PPD #2/ [id number]  Doing well  Continue routine post partum orders  Peripheral edema Iron deficiency anemia P) lasix. elev feet cont routine postpartum care

## 2011-08-25 NOTE — Brief Op Note (Signed)
08/22/2011 - 08/23/2011  3:51 PM  PATIENT:  Allison Santos  31 y.o. female  PRE-OPERATIVE DIAGNOSIS:  34 2/7 weeks with preeclampsia, triplets  POST-OPERATIVE DIAGNOSIS:  34 2/7 weeks with preeclampsia, triplets  PROCEDURE:  Procedure(s) (LRB): Primary Low transverse CESAREAN SECTION (N/A)  SURGEON:  Surgeon(s) and Role:    * Serita Kyle, MD - Primary  PHYSICIAN ASSISTANT:   ASSISTANTS: Arlan Organ, CNM   ANESTHESIA:   spinal  EBL:   800  BLOOD ADMINISTERED:none  FINDINGS: LIVE FEMALE #1 FRANK BREECH. APGAR 7/8 lIVE FEMALE #2 BREECH APGAR 6/9, LIVE FEMALE TRANSVERSE CONVERTED TO VTX, NL TUBES AND OVARIES  DRAINS: none   LOCAL MEDICATIONS USED:  MARCAINE     SPECIMEN:  Source of Specimen:  placenta x 3  DISPOSITION OF SPECIMEN:  PATHOLOGY  COUNTS:  YES  TOURNIQUET:  * No tourniquets in log *  DICTATION: .Other Dictation: Dictation Number   PLAN OF CARE: Admit to inpatient   PATIENT DISPOSITION:  PACU - hemodynamically stable.   Delay start of Pharmacological VTE agent (>24hrs) due to surgical blood loss or risk of bleeding: no

## 2011-08-26 NOTE — Progress Notes (Addendum)
Patient ID: Allison Santos, female   DOB: Sep 29, 1980, 31 y.o.   MRN: [id number] POD # 3 c/section  Subjective: Pt reports feeling well/ Pain controlled with ibuprofen (OTC) and narcotic analgesics including percocet Tolerating po/Voiding without problems/ No n/v/Flatus yes Activity: ad lib Bleeding is spotting Newborn info:  Information for the patient's newborn:  Racquelle, Hyser [[id number]]  female Information for the patient's newborn:  Jossette, Zirbel [[id number]]  female Information for the patient's newborn:  Kiwana, Deblasi [[id number]]  female Feeding: breast  Objective: VS: Blood pressure 124/82, pulse 85, temperature 97.8 F (36.6 C), temperature source Oral, resp. rate 18, height 5' 4.96" (1.65 m), weight 70.171 kg (154 lb 11.2 oz), last menstrual period 12/26/2010, SpO2 98.00%, unknown if currently breastfeeding.   LABS:  Basename 08/24/11 0515 08/23/11 1445  WBC 8.0 8.8  HGB 8.2* 8.7*  HCT 24.3* 25.9*  PLT 138* 145*     Physical Exam:  General: alert CV: Regular rate and rhythm Resp: clear Abdomen: soft, nontender, normal bowel sounds Incision: healing well Uterine Fundus: firm, below umbilicus, nontender Lochia: minimal Ext: edema 1+pitting edema    A/P: POD # 3/ G1P0103/ S/P C/Section  Doing well  Peripheral edema-continue with hctz Anemia-asymptomatic Continue with oral iron Consulted with Dr. Cherly Hensen  SignedLynden Ang, [facility] 08/26/2011, 9:46 AM

## 2011-08-26 NOTE — Progress Notes (Signed)
PSYCHOSOCIAL ASSESSMENT ~ MATERNAL/CHILD Name:  Allison Santos, Allison Santos, Allison Santos        Age: 30 days    Referral Date: 08/24/2011   Reason/Source: NICU admission for triplet delivery I. FAMILY/HOME ENVIRONMENT A. Child's Legal Guardian Parent(s)    Name: Allison Santos  DOB: 1980-07-28    Age: 64 Address:  [address], [place], [place] [address] Name: Hunt Oris Address: same B. Other Household Members/Support Persons Name:  Maternal grandmother          C.   Other Support:  Mom's friend who lives nearby II. PSYCHOSOCIAL DATA A. Information Source X Patient Interview  X Family Interview            B. Information systems manager Insurance-[employer]        X Food Stamps- MOB plans to apply      X WIC- MOB receives, plans to apply for babies  X School - MOB: [employer], PhD/2nd year in August/Biochemestry   FOB: [employer], PhD/Computational Science  C. Cultural and Environment Information/Cultural Issues Impacting Care: N/A III. STRENGTHS X Supportive family/friends   X Adequate Resources-some basic supplies-car seats, cribs  X Compliance with medical plan  X Understanding of illness            IV. RISK FACTORS AND CURRENT PROBLEMS        X Other- 1st pregnancy/triplet delivery, limited supports/resources             V. SOCIAL WORK ASSESSMENT Met with MOB at bedside to assess strengths, needs, coping related to the birth of her triplets and subsequent NICU admission.  Maternal grandmother present during visit, she is non-English speaking.  Maternal grandmother is planning to stay for a few months to help MOB and FOB adjust to life with their babies.  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  FOB is currently not working or going to school.  MOB reports FOB is planning to start his PhD in [employer] at [employer] in August.    MOB reports she has 1 friend who lives in the same apartment  complex, and due to the friend also being a Consulting civil engineer may not be available much to help.  MOB reports she was put in touch with an agency who provided her with free car seats.  MOB receives [facility] and has insurance through the school, and she intends to apply for Medicaid and WIC for her babies.  MOB reports she and FOB have some personal savings and are meeting their housing and utility obligations.  MOB discussed possibly applying for Food Stamps for her family when she discharges.  She reports that she is hoping for scholarships and stipends in August when she and FOB go back to school.  I inquired as to who would watch the children while she and FOB are at school, and she reports a child care service, though they have not pursued that as of yet.   MOB reports she may get some help from her family who lives in her home country, but she is unsure.  She is tired at this time, but she reports feeling good.  She was initially shocked on finding out she was having triplets, but she has not accepted and embraced being a mom of triplets.  She is positive and feels things will work out.  She reports not having enough clothes or diapers for when babies discharge.  We discussed some supports that may be available to family at this time.  Explained NICU services and ways we can support families.  MOB appreciative and healing well.  She was pleasant, engaging, and participatory in looking for solutions.     VI. SOCIAL WORK PLAN X No Further Intervention Required/No Barriers to Discharge X Patient/Family Education: NICU Brochure  Staci Acosta, MSW LCSW, 08/26/2011, 10:03 am

## 2011-08-27 MED ORDER — BENZOCAINE-MENTHOL 20-0.5 % EX AERO
INHALATION_SPRAY | CUTANEOUS | Status: AC
  Filled 2011-08-27: qty 56

## 2011-08-27 MED ORDER — POLYSACCHARIDE IRON COMPLEX 150 MG PO CAPS: 150.0000 mg | ORAL_CAPSULE | Freq: Two times a day (BID) | ORAL | Status: AC

## 2011-08-27 MED ORDER — IBUPROFEN 600 MG PO TABS: 600.0000 mg | ORAL_TABLET | Freq: Four times a day (QID) | ORAL | Status: AC

## 2011-08-27 MED ORDER — HYDROCHLOROTHIAZIDE 25 MG PO TABS: 25.0000 mg | ORAL_TABLET | Freq: Every day | ORAL | Status: AC

## 2011-08-27 MED ORDER — OXYCODONE-ACETAMINOPHEN 5-325 MG PO TABS: 1.0000 | ORAL_TABLET | ORAL | Status: AC | PRN

## 2011-08-27 NOTE — Discharge Summary (Signed)
POSTOPERATIVE DISCHARGE SUMMARY:  Patient ID: Allison Santos MRN: [id number] DOB/AGE: 30/10/1980 30 y.o.  Admit date: 08/22/2011 Discharge date:  08/27/2011   Admission Diagnoses: Triplet pregnancy at 34 1/7 weeks  Preeclampsia   Discharge Diagnoses:   Post-operative day 4, post primary cesarean section for triplet pregnancy Preeclampsia delivered / resolving Dependent edema Acute blood loss anemia, stable  Prenatal history: G1P0103   EDC : 10/02/2011, by embryo transfer date  Prenatal care at [facility] since [redacted] weeks gestation  Prenatal course complicated by triplet pregnancy, reproductive assistance, preeclampsiaunknown  Prenatal Labs: ABO, Rh: B (08/05 0000)  Antibody: NEG (03/14 0528) Rubella:    RPR: NON REACTIVE (03/13 1905)  HBsAg: Negative (08/05 0000)  HIV: Non-reactive (08/05 0000)  GBS:    1 hr Glucola : normal   Medical / Surgical History :  Past medical history:  Past Medical History  Diagnosis Date  . Infertility, female   . Pregnancy resulting from in vitro fertilization   . Pregnancy induced hypertension   . PP care - s/p 1C/S 3/14 (triplet gest) 08/24/2011  . Mild preeclampsia delivered 08/24/2011    Past surgical history: History reviewed. No pertinent past surgical history.  Family History:  Family History  Problem Relation Age of Onset  . Hypertension Father     Social History:  reports that she has never smoked. She has never used smokeless tobacco. She reports that she does not drink alcohol or use illicit drugs.   Allergies: Tape    Current Medications at time of admission:  Prescriptions prior to admission  Medication Sig Dispense Refill  . Prenatal Vit-Fe Fumarate-FA (PRENATAL MULTIVITAMIN) TABS Take 1 tablet by mouth daily.      Marland Kitchen DISCONTD: acetaminophen (TYLENOL) 500 MG tablet Take 500 mg by mouth every 6 (six) hours as needed. For pain      . DISCONTD: Ferrous Sulfate (IRON) 90 (18 FE) MG TABS Take 1 tablet  by mouth daily. Take 1 hour after meal           Intrapartum Course: Patient presented on 08/22/2011 at 34 1/[redacted] wk gestation with triplets and newly diagnosed preeclampsia. She complained of increased leg swelling, heartburn starting since the previous day, mild head ache, denies visual changes. Patient was seen by Maternal Fetal Medicine on 08/22/2011 and was found to have elevated BP and sent to office for evaluation. She had been seen over week-end for evaluation of elevated BP. PIH labs were normal and patient was sent home to do 24 hr urine collection for total protein/creatinine. The 24 hr UTP was over 700 mg. Sono on 3/6 with MFM had shown triplet B's growth less than third percentile for parameters. Given new findings of preeclampsia in the setting of growth concern, delivery was indicated. Case reviewed with MFM.  Admit labs:   08/23/2011 05:28  WBC 7.9  RBC 3.25 (L)  HGB 9.6 (L)  HCT 28.2 (L)  MCV 86.8  MCH 29.5  MCHC 34.0  RDW 13.1  Platelets 151     08/22/2011 19:05  Uric Acid, Serum 4.8  AST 21  ALT 9  Total Protein 5.8 (L)  Total Bilirubin 0.3   Procedures: Cesarean section delivery of one female and 2 female newborns by Dr Cherly Hensen  See operative report for further details  Postoperative / postpartum course: Patient's BP normalized post operative. Her PIH labs remained stable, there was no indication of worsening preeclampsia. Maternal anemia noted on labs but she remained asymptomatic and was treated  with oral iron supplementation. She had generalized edema in lower extremities and was given daily diuretic. Patient was started on breast pumping on first day as she desires to breast feed her newborns and colostrum was stored and given to newborns which remain stable in neonatal unit.   Physical Exam:  VSS: Blood pressure 111/70, pulse 72, temperature 98.4 F (36.9 C), temperature source Oral, resp. rate 18, height 5' 4.96" (1.65 m), weight 70.171 kg (154 lb 11.2 oz), last  menstrual period 12/26/2010, SpO2 98.00%, unknown if currently breastfeeding.  Post-op labs:    08/23/2011 14:45 08/24/2011 05:15  WBC 8.8 8.0  RBC 2.97 (L) 2.79 (L)  HGB 8.7 (L) 8.2 (L)  HCT 25.9 (L) 24.3 (L)  MCV 87.2 87.1  MCH 29.3 29.4  MCHC 33.6 33.7  RDW 13.0 13.1  Platelets 145 (L) 138 (L)    08/23/2011 14:45  Uric Acid, Serum 5.4  AST 19  ALT 6  Total Protein 4.8 (L)  Total Bilirubin 0.2 (L)    I&O: I/O last 3 completed shifts: In: 840 [P.O.:840] Out: 2250 [Urine:2250]      Incision:  approximated with staples / no erythema / no ecchymosis / no drainage Staples:  removed prior to discharge and replaced with benzoin and steri strips.   Discharge Instructions:  Discharged Condition: good Activity: pelvic rest and weight lifting restrictions x 2 weeks Diet: routine Medications:  Medication List  As of 08/27/2011  9:58 AM   STOP taking these medications         acetaminophen 500 MG tablet      Iron 90 (18 FE) MG Tabs         TAKE these medications         hydrochlorothiazide 25 MG tablet   Commonly known as: HYDRODIURIL   Take 1 tablet (25 mg total) by mouth daily.      ibuprofen 600 MG tablet   Commonly known as: ADVIL,MOTRIN   Take 1 tablet (600 mg total) by mouth every 6 (six) hours.      iron polysaccharides 150 MG capsule   Commonly known as: NIFEREX   Take 1 capsule (150 mg total) by mouth 2 (two) times daily.      oxyCODONE-acetaminophen 5-325 MG per tablet   Commonly known as: PERCOCET   Take 1-2 tablets by mouth every 3 (three) hours as needed (moderate - severe pain).      prenatal multivitamin Tabs   Take 1 tablet by mouth daily.           Condition: stable Postpartum Instructions: refer to practice specific booklet Discharge to: home Disposition: 01-Home or Self Care Follow up :  Follow-up Information    Follow up with [facility]. Schedule an appointment as soon as possible for a visit in 1 week. (BP check)       Follow  up with COUSINS,SHERONETTE A, MD. Schedule an appointment as soon as possible for a visit in 6 weeks.   Contact information:   [address] [place] [address] [phone]           Signed: Arlan Organ 08/27/2011, 9:58 AM

## 2011-08-27 NOTE — Progress Notes (Signed)
Subjective: POD# 4 Information for the patient's newborn:  Alyna, [place] [[id number]]  female Information for the patient's newborn:  Vernica, Wachtel [[id number]]  female Information for the patient's newborn:  Nyaja, [place] [[id number]]  female  NB's remain stable in NICU Reports feeling well, denies PEC s/s, + edema in LE's Feeding: breast; expressing breast milk with lactation assistance Patient reports tolerating PO.  Breast symptoms:none, pumping, + colostrum Pain controlled with Motrin and Percocet Denies HA/SOB/C/P/N/V/dizziness. Flatus present, +BM. She reports vaginal bleeding as normal, without clots.  She is ambulating, urinating without difficult.     Objective:   VS:  Filed Vitals:   08/26/11 1200 08/26/11 1800 08/26/11 2145 08/27/11 0627  BP: 131/85 136/86 131/85 111/70  Pulse: 71 85 67 72  Temp: 98.3 F (36.8 C) 98.5 F (36.9 C) 98.4 F (36.9 C) 98.4 F (36.9 C)  TempSrc: Oral Oral Oral Oral  Resp: 22 18 18 18   Height:      Weight:      SpO2: 99% 98% 98% 98%      Intake/Output Summary (Last 24 hours) at 08/27/11 0945 Last data filed at 08/26/11 2200  Gross per 24 hour  Intake    720 ml  Output   1550 ml  Net   -830 ml          Blood type: --/--/B POS, B POS (03/14 0528)  Rubella:   immune    Physical Exam:  General: alert and cooperative CV: Regular rate and rhythm Resp: clear Abdomen: soft, nontender, normal BS Incision: clean, dry and intact, staples in place Uterine Fundus: firm, below umbilicus -1; nontender Lochia: minimal Ext: edema +1 pedal and Homans sign is negative, no sign of DVT      Assessment/Plan: 31 y.o.   POD# 1.  s/p Cesarean Delivery.  Indications: preeclampsia and triplet gestation              Active Problems:  PP care - s/p cesarean section- triplets (3/14)   Mild preeclampsia delivered  Chronic anemia  Maternal anemia, with delivery Preeclampsia-mild; no signs of exacerbation; good  diuresis continues  BP stable Dependent edema, continue HCTZ x 1 weeks  Push PO fluids Chronic anemia with superimposed blood loss anemia  Continue oral iron supplementation Doing well, stable post-op  DC staples and replace w/ steri strips  Lactation assistance PRN  Routine post-op care DC home with WOB instructions F/U in office for BP check in 1 week   Xaviar Lunn 08/27/2011, 9:45 AM

## 2011-08-27 NOTE — Discharge Instructions (Signed)
Breast Pumping Tips Pumping your breast milk is a good way to stimulate milk production and have a steady supply of breast milk for your infant. Pumping is most helpful during your infant's growth spurts, when involving dad or a family member, or when you are away. There are several types of pumps available. They can be purchased at a baby or maternity store. You can begin pumping soon after delivery, but some experts believe that you should wait about four weeks to give your infant a bottle. In general, the more you breastfeed or pump, the more milk you will have for your infant. It is also important to take good care of yourself. This will reduce stress and help your body to create a healthy supply of milk. Your caregiver or lactation consultant can give you the information and support you need in your efforts to breastfeed your infant. PUMPING BREAST MILK  Follow the tips below for successful breast pumping. Take care of yourself.  Drink enough water or fluids to keep urine clear or pale yellow. You may notice a thirsty feeling while breastfeeding. This is because your body needs more water to make breast milk. Keep a large water bottle handy. Make healthy drink choices such as unsweetened fruit juice, milk and water. Limit soda, coffee, and alcohol (wait 2 hours to feed or pump if you have an alcoholic drink.)   Eat a healthy, well-balanced diet rich in fruits, vegetables, and whole grains.   Exercise as recommended by your caregiver.   Get plenty of sleep. Sleep when your infant sleeps. Ask friends and family for help if you need time to nap or rest.   Do not smoke. Smoking can lower your milk supply and harm your infant. If you need help quitting, ask your caregiver for a program recommendation.   Ask your caregiver about birth control options. Birth control pills may lower your milk supply. You may be advised to use condoms or other forms of birth control.  Relax and pump Stimulating your  let-down reflex is the key to successful and effective pumping. This makes the milk in all parts of the breast flow more freely.   It is easier to pump breast milk (and breastfeed) while you are relaxed. Find techniques that work for you. Quiet private spaces, breast massage, soothing heat placed on the breast, music, and pictures or a tape recording of your infant may help you to relax and "let down" your milk. If you have difficulty with your let down, try smelling one of your infant's blankets or an item of clothing he or she has worn while you are pumping.   When pumping, place the special suction cup (flange) directly over the nipple. It may be uncomfortable and cause nipple damage if it is not placed properly or is the wrong size. Applying a small amount of purified or modified lanolin to your nipple and the areola may help increase your comfort level. Also, you can change the speed and suction of many electric pumps to your comfort level. Your caregiver or lactation consultant can help you with this.   If pumping continues to be painful, or you feel you are not getting very much milk when you pump, you may need a different type of pump. A lactation consultant can help you determine if this is the case.   If you are with your infant, feed him or her on demand and try pumping after each feeding. This will boost your production, even if milk   does not come out. You may not be able to pump much milk at first, but keep up the routine, and this will change.   If you are working or away from your infant for several hours, try pumping for about 15 minutes every 2 to 3 hours. Pump both breasts at the same time if you can.   If your infant has a formula feeding, make sure you pump your milk around the same time to maintain your supply.   Begin pumping breast milk a few weeks before you return to work. This will help you develop techniques that work for you and will be able to store extra milk.   Find a  source of breastfeeding information that works well for you.  TIPS FOR STORING BREAST MILK  Store breast milk in a sealable sterile bag, jar, or container provided with your pumping supplies.   Store milk in small amounts close to what your infant is drinking at each feeding.   Cool pumped milk in a refrigerator or cooler. Pumped milk can last at the back of the refrigerator for 3 to 8 days.   Place cooled milk at the back of the freezer for up to 3 months.   Thaw the milk in its container or bag in warm water up to 24 hours in advance. Do not use a microwave to thaw or heat milk. Do not refreeze the milk after it has been thawed.   Breast milk is safe to drink when left at room temperature (mid 70s or colder) for 4 to 8 hours. After that, throw it away.   Milk fat can separate and look funny. The color can vary slightly from day to day. This is normal. Always shake the milk before using it to mix the fat with the more watery portion.  SEEK MEDICAL CARE IF:   You are having trouble pumping or feeding your infant.   You are concerned that you are not making enough milk.   You have nipple pain, soreness, or redness.   You have other questions or concerns related to you or your infant.  Document Released: 11/15/2009 Document Revised: 05/17/2011 Document Reviewed: 11/15/2009 Exit[employer] Patient Information 2012 [employer]. 

## 2011-08-28 ENCOUNTER — Encounter (HOSPITAL_COMMUNITY): Payer: Self-pay | Admitting: Obstetrics and Gynecology

## 2011-08-28 ENCOUNTER — Other Ambulatory Visit (HOSPITAL_COMMUNITY): Payer: PRIVATE HEALTH INSURANCE

## 2011-08-28 ENCOUNTER — Ambulatory Visit (HOSPITAL_COMMUNITY): Payer: PRIVATE HEALTH INSURANCE

## 2011-08-29 ENCOUNTER — Ambulatory Visit (HOSPITAL_COMMUNITY): Payer: PRIVATE HEALTH INSURANCE

## 2011-09-05 ENCOUNTER — Ambulatory Visit (HOSPITAL_COMMUNITY): Payer: PRIVATE HEALTH INSURANCE

## 2011-10-02 ENCOUNTER — Ambulatory Visit (HOSPITAL_COMMUNITY)
Admission: RE | Admit: 2011-10-02 | Discharge: 2011-10-02 | Disposition: A | Discharge status: Home / Self Care | Payer: PRIVATE HEALTH INSURANCE | Source: Ambulatory Visit | Attending: Obstetrics and Gynecology | Admitting: Obstetrics and Gynecology

## 2011-10-02 DIAGNOSIS — M79604 Pain in right leg: Secondary | ICD-10-CM

## 2011-10-02 DIAGNOSIS — M79609 Pain in unspecified limb: Secondary | ICD-10-CM

## 2011-10-02 NOTE — Progress Notes (Signed)
*  PRELIMINARY RESULTS* Vascular Ultrasound Lower extremity venous duplex has been completed.  Preliminary findings: Bilaterally no evidence of DVT or baker's cyst.  Farrel Demark, RDMS 10/02/2011, 11:29 AM

## 2013-07-18 ENCOUNTER — Emergency Department (HOSPITAL_COMMUNITY)
Admission: EM | Admit: 2013-07-18 | Discharge: 2013-07-18 | Disposition: A | Discharge status: Home / Self Care | Payer: PRIVATE HEALTH INSURANCE | Attending: Emergency Medicine | Admitting: Emergency Medicine

## 2013-07-18 ENCOUNTER — Encounter (HOSPITAL_COMMUNITY): Payer: Self-pay | Admitting: Emergency Medicine

## 2013-07-18 DIAGNOSIS — H9209 Otalgia, unspecified ear: Secondary | ICD-10-CM | POA: Insufficient documentation

## 2013-07-18 DIAGNOSIS — J069 Acute upper respiratory infection, unspecified: Secondary | ICD-10-CM | POA: Insufficient documentation

## 2013-07-18 DIAGNOSIS — Z79899 Other long term (current) drug therapy: Secondary | ICD-10-CM | POA: Insufficient documentation

## 2013-07-18 DIAGNOSIS — Z8742 Personal history of other diseases of the female genital tract: Secondary | ICD-10-CM | POA: Insufficient documentation

## 2013-07-18 DIAGNOSIS — R000 Tachycardia, unspecified: Secondary | ICD-10-CM | POA: Insufficient documentation

## 2013-07-18 LAB — RAPID STREP SCREEN (MED CTR MEBANE ONLY): Streptococcus, Group A Screen (Direct): NEGATIVE

## 2013-07-18 MED ORDER — LORATADINE-PSEUDOEPHEDRINE ER 5-120 MG PO TB12: 1.0000 | ORAL_TABLET | Freq: Two times a day (BID) | ORAL | Status: AC

## 2013-07-18 NOTE — ED Provider Notes (Signed)
Medical screening examination/treatment/procedure(s) were performed by non-physician practitioner and as supervising physician I was immediately available for consultation/collaboration.  EKG Interpretation   None         Darlys Galesavid Masneri, MD 07/18/13 [phone]

## 2013-07-18 NOTE — ED Notes (Signed)
Pt reports earache, sore throat since yesterday. Is here with her children who are being seen in PEDS for same. Pt is a x 4.

## 2013-07-18 NOTE — ED Provider Notes (Signed)
CSN: [id number]     Arrival date & time 07/18/13  1648 History  This chart was scribed for non-physician practitioner Janne NapoleonHope M Neese, NP working with Darlys Galesavid Masneri, MD by Valera CastleSteven Perry, ED scribe. This patient was seen in room TR07C/TR07C and the patient's care was started at 5:07 PM.   Chief Complaint  Patient presents with  . Otalgia  . Sore Throat    The history is provided by the patient. No language interpreter was used.   HPI Comments: Allison Santos is a 33 y.o. female who presents to the Emergency Department complaining of constant sore throat and bilateral otalgia, with associated nasal congestion and fever, onset yesterday morning, worse today. She reports her pain severity is a 9/10. She states her fever had a max temperature of 101.3, and reports that her sore throat is worse in the mornings. She reports having a viral infection last week, but felt better 4 days ago. She denies visual disturbance, cough, nausea, diarrhea, constipation, abdominal pain, urinary symptoms, body aches, and any other associated symptoms. He three children have similar symptoms.   PCP - Pcp Not In System  Past Medical History  Diagnosis Date  . Infertility, female   . Pregnancy resulting from in vitro fertilization   . Pregnancy induced hypertension   . PP care - s/p 1C/S 3/14 (triplet gest) 08/24/2011  . Mild preeclampsia delivered 08/24/2011   Past Surgical History  Procedure Laterality Date  . Cesarean section  08/23/2011    Procedure: CESAREAN SECTION;  Surgeon: Serita KyleSheronette A Cousins, MD;  Location: [facility];  Service: Gynecology;  Laterality: N/A;  Primary    Family History  Problem Relation Age of Onset  . Hypertension Father    History  Substance Use Topics  . Smoking status: Never Smoker   . Smokeless tobacco: Never Used  . Alcohol Use: No   OB History   Grav Para Term Preterm Abortions TAB SAB Ect Mult Living   1 1 0 1 0 0 0 0 1 3      Review of Systems  Constitutional: Positive for  fever.  HENT: Positive for congestion (nasal), ear pain, sore throat and voice change.   Eyes: Negative for visual disturbance.  Respiratory: Negative for cough.   Gastrointestinal: Negative for nausea, vomiting, abdominal pain, diarrhea and constipation.  Genitourinary: Negative.   Musculoskeletal: Negative for myalgias.   Allergies  Cefuroxime and Tape  Home Medications   Current Outpatient Rx  Name  Route  Sig  Dispense  Refill  . Prenatal Vit-Fe Fumarate-FA (PRENATAL MULTIVITAMIN) TABS   Oral   Take 1 tablet by mouth daily.          BP 108/56  Pulse 106  Temp(Src) 97.6 F (36.4 C) (Oral)  Resp 20  SpO2 96%  LMP 07/12/2013  Physical Exam  Nursing note and vitals reviewed. Constitutional: She is oriented to person, place, and time. She appears well-developed and well-nourished. No distress.  HENT:  Head: Normocephalic and atraumatic.  Right Ear: Tympanic membrane, external ear and ear canal normal. No swelling. Tympanic membrane is not erythematous.  Left Ear: Tympanic membrane, external ear and ear canal normal. No swelling. Tympanic membrane is not erythematous.  Mouth/Throat: Uvula is midline and mucous membranes are normal. Posterior oropharyngeal erythema (mild) present. No tonsillar abscesses.  Eyes: Conjunctivae and EOM are normal. Pupils are equal, round, and reactive to light. No scleral icterus.  Neck: Normal range of motion. Neck supple. No rigidity. No tracheal deviation present. No thyromegaly present.  Cardiovascular: Regular rhythm and normal heart sounds.  Tachycardia present.   No murmur heard. Pulmonary/Chest: Effort normal and breath sounds normal. No respiratory distress. She has no wheezes. She has no rales.  Abdominal: Soft. There is no tenderness. There is no CVA tenderness.  Musculoskeletal: Normal range of motion.  Lymphadenopathy:    She has cervical adenopathy (mild cervical adenopathy bilaterally).  Neurological: She is alert and oriented  to person, place, and time.  Skin: Skin is warm and dry.  Psychiatric: She has a normal mood and affect. Her behavior is normal.    ED Course  Procedures (including critical care time)  DIAGNOSTIC STUDIES: Oxygen Saturation is 96% on room air, normal by my interpretation.    COORDINATION OF CARE: 5:13 PM-Discussed treatment plan which includes strep screen with pt at bedside and pt agreed to plan.   Labs Review  Review Results for orders placed during the hospital encounter of 07/18/13 (from the past 24 hour(s))  RAPID STREP SCREEN     Status: None   Collection Time    07/18/13  5:15 PM      Result Value Range   Streptococcus, Group A Screen (Direct) NEGATIVE  NEGATIVE    MDM  33 y.o. female with sore throat, ear discomfort and nasal congestion. Strep screen negative. Will treat for viral URI. If her symptoms continue or worsen she will follow up with ENT. Will give decongestants and she will take ibuprofen and tylenol as needed for discomfort. She will return as needed. Stable for discharge without further screening. Vital signs stable and O2 SAT 96% on R/A. I have reviewed this patient's vital signs, nurses notes, appropriate labs and discussed findings and plan of care with the patient. She voices understanding.     Medication List    TAKE these medications       loratadine-pseudoephedrine 5-120 MG per tablet  Commonly known as:  CLARITIN-D 12 HOUR  Take 1 tablet by mouth 2 (two) times daily.      ASK your doctor about these medications       acetaminophen 500 MG tablet  Commonly known as:  TYLENOL  Take 500 mg by mouth every 6 (six) hours as needed for mild pain.     vitamin C 500 MG tablet  Commonly known as:  ASCORBIC ACID  Take 500 mg by mouth daily.        I personally performed the services described in this documentation, which was scribed in my presence. The recorded information has been reviewed and is accurate.    [place], [place] 07/18/13 [phone]

## 2013-07-20 LAB — CULTURE, GROUP A STREP

## 2014-04-12 ENCOUNTER — Encounter (HOSPITAL_COMMUNITY): Payer: Self-pay | Admitting: Emergency Medicine
